# Patient Record
Sex: Female | Born: 1935 | Race: White | Hispanic: No | Marital: Single | Smoking: Never smoker
Health system: Southern US, Community
[De-identification: ages and names within clinical notes are randomized; demographics above are authoritative.]

## PROBLEM LIST (undated history)

## (undated) DIAGNOSIS — E7800 Pure hypercholesterolemia, unspecified: Secondary | ICD-10-CM

## (undated) DIAGNOSIS — I10 Essential (primary) hypertension: Secondary | ICD-10-CM

## (undated) HISTORY — PX: EYE SURGERY: SHX253

## (undated) HISTORY — PX: TONSILLECTOMY: SUR1361

## (undated) HISTORY — PX: THYROID SURGERY: SHX805

---

## 2011-03-10 DIAGNOSIS — I4510 Unspecified right bundle-branch block: Secondary | ICD-10-CM | POA: Insufficient documentation

## 2011-08-30 DIAGNOSIS — E7800 Pure hypercholesterolemia, unspecified: Secondary | ICD-10-CM | POA: Insufficient documentation

## 2011-08-30 DIAGNOSIS — E213 Hyperparathyroidism, unspecified: Secondary | ICD-10-CM | POA: Insufficient documentation

## 2011-08-30 DIAGNOSIS — I10 Essential (primary) hypertension: Secondary | ICD-10-CM | POA: Insufficient documentation

## 2012-03-19 ENCOUNTER — Encounter (HOSPITAL_COMMUNITY): Payer: Self-pay | Admitting: Emergency Medicine

## 2012-03-19 ENCOUNTER — Emergency Department (HOSPITAL_COMMUNITY)
Admission: EM | Admit: 2012-03-19 | Discharge: 2012-03-19 | Disposition: A | Discharge status: Home / Self Care | Payer: Medicare Other | Attending: Emergency Medicine | Admitting: Emergency Medicine

## 2012-03-19 ENCOUNTER — Emergency Department (HOSPITAL_COMMUNITY): Payer: Medicare Other

## 2012-03-19 DIAGNOSIS — R911 Solitary pulmonary nodule: Secondary | ICD-10-CM

## 2012-03-19 DIAGNOSIS — Z79899 Other long term (current) drug therapy: Secondary | ICD-10-CM | POA: Insufficient documentation

## 2012-03-19 DIAGNOSIS — Z7982 Long term (current) use of aspirin: Secondary | ICD-10-CM | POA: Insufficient documentation

## 2012-03-19 DIAGNOSIS — R079 Chest pain, unspecified: Secondary | ICD-10-CM | POA: Insufficient documentation

## 2012-03-19 DIAGNOSIS — I10 Essential (primary) hypertension: Secondary | ICD-10-CM | POA: Insufficient documentation

## 2012-03-19 DIAGNOSIS — R112 Nausea with vomiting, unspecified: Secondary | ICD-10-CM | POA: Insufficient documentation

## 2012-03-19 DIAGNOSIS — E860 Dehydration: Secondary | ICD-10-CM | POA: Insufficient documentation

## 2012-03-19 DIAGNOSIS — R918 Other nonspecific abnormal finding of lung field: Secondary | ICD-10-CM | POA: Insufficient documentation

## 2012-03-19 DIAGNOSIS — K219 Gastro-esophageal reflux disease without esophagitis: Secondary | ICD-10-CM | POA: Insufficient documentation

## 2012-03-19 DIAGNOSIS — E7800 Pure hypercholesterolemia, unspecified: Secondary | ICD-10-CM | POA: Insufficient documentation

## 2012-03-19 HISTORY — DX: Pure hypercholesterolemia, unspecified: E78.00

## 2012-03-19 HISTORY — DX: Essential (primary) hypertension: I10

## 2012-03-19 LAB — CBC WITH DIFFERENTIAL/PLATELET
Basophils Relative: 0 % (ref 0–1)
Eosinophils Absolute: 0 10*3/uL (ref 0.0–0.7)
Eosinophils Relative: 0 % (ref 0–5)
Hemoglobin: 14 g/dL (ref 12.0–15.0)
MCH: 29.4 pg (ref 26.0–34.0)
MCHC: 32.9 g/dL (ref 30.0–36.0)
MCV: 89.3 fL (ref 78.0–100.0)
Monocytes Absolute: 0.6 10*3/uL (ref 0.1–1.0)
Monocytes Relative: 5 % (ref 3–12)
Neutrophils Relative %: 91 % — ABNORMAL HIGH (ref 43–77)

## 2012-03-19 LAB — COMPREHENSIVE METABOLIC PANEL
Albumin: 3.8 g/dL (ref 3.5–5.2)
BUN: 20 mg/dL (ref 6–23)
Calcium: 10.8 mg/dL — ABNORMAL HIGH (ref 8.4–10.5)
Creatinine, Ser: 0.79 mg/dL (ref 0.50–1.10)
GFR calc Af Amer: >90 mL/min (ref >90)
Potassium: 4.3 mEq/L (ref 3.5–5.1)
Total Protein: 6.8 g/dL (ref 6.0–8.3)

## 2012-03-19 LAB — TROPONIN I
Troponin I: <0.3 ng/mL (ref <0.30)
Troponin I: <0.3 ng/mL (ref <0.30)

## 2012-03-19 LAB — LIPASE, BLOOD: Lipase: 29 U/L (ref 11–59)

## 2012-03-19 MED ORDER — ONDANSETRON 4 MG PO TBDP: ORAL_TABLET | ORAL | Status: DC

## 2012-03-19 MED ORDER — SODIUM CHLORIDE 0.9 % IV BOLUS (SEPSIS)
1000.0000 mL | Freq: Once | INTRAVENOUS | Status: AC
  Administered 2012-03-19: 1000 mL via INTRAVENOUS

## 2012-03-19 MED ORDER — FAMOTIDINE IN NACL 20-0.9 MG/50ML-% IV SOLN
20.0000 mg | Freq: Once | INTRAVENOUS | Status: AC
  Administered 2012-03-19: 20 mg via INTRAVENOUS
  Filled 2012-03-19: qty 50

## 2012-03-19 MED ORDER — ONDANSETRON HCL 4 MG/2ML IJ SOLN
4.0000 mg | Freq: Once | INTRAMUSCULAR | Status: AC
  Administered 2012-03-19: 4 mg via INTRAVENOUS
  Filled 2012-03-19: qty 2

## 2012-03-19 MED ORDER — GI COCKTAIL ~~LOC~~
30.0000 mL | Freq: Once | ORAL | Status: DC
  Filled 2012-03-19: qty 30

## 2012-03-19 NOTE — ED Notes (Signed)
Patient unable to tolerate eating any of her food tray. She drank her tea only.

## 2012-03-19 NOTE — ED Provider Notes (Signed)
History     CSN: [id number]  Arrival date & time 03/19/12  7829   First MD Initiated Contact with Patient 03/19/12 [phone]      Chief Complaint  Patient presents with  . Heartburn  . Nausea  . Emesis    (Consider location/radiation/quality/duration/timing/severity/associated sxs/prior treatment) The history is provided by the patient.  Susan Shaffer is a 78 y.o. female hx of HTN, HL here with nausea, vomiting, and burning chest pain. Burning sensation in her chest since yesterday. Intermittent, worse at night and with eating a lot of food. No fever or chills or cough. + vomiting last night. No abdominal pain or diarrhea. No sick contacts. No hx of CAD or stents.    Past Medical History  Diagnosis Date  . Hypertension   . High cholesterol     Past Surgical History  Procedure Date  . Eye surgery cataract  . Tonsillectomy     No family history on file.  History  Substance Use Topics  . Smoking status: Never Smoker   . Smokeless tobacco: Not on file  . Alcohol Use: 0.6 oz/week    1 Glasses of wine per week     Comment: ocassional1    OB History    Grav Para Term Preterm Abortions TAB SAB Ect Mult Living                  Review of Systems  Cardiovascular: Positive for chest pain.  Gastrointestinal: Positive for vomiting.  All other systems reviewed and are negative.    Allergies  Alavert and Doxycycline  Home Medications   Current Outpatient Rx  Name  Route  Sig  Dispense  Refill  . AMLODIPINE BESYLATE 5 MG PO TABS   Oral   Take 5 mg by mouth daily.         . ASPIRIN 81 MG PO CHEW   Oral   Chew 324 mg by mouth once.         Marland Kitchen BETAMETHASONE VALERATE 0.12 % EX FOAM   Topical   Apply 1 application topically daily as needed. Apply behind ears as needed for rash         . CALCIUM CARBONATE ANTACID 500 MG PO CHEW   Oral   Chew 2 tablets by mouth daily as needed. For heartburn         . CLOTRIMAZOLE 1 % EX CREA   Topical   Apply 1  application topically 2 (two) times daily as needed. Apply to feet as needed for itching         . PEPCID COMPLETE PO   Oral   Take 1 tablet by mouth daily as needed. For heartburn         . FLUOXETINE HCL 20 MG PO CAPS   Oral   Take 20 mg by mouth daily.         Marland Kitchen GLUCOSAMINE MSM COMPLEX PO   Oral   Take 1 tablet by mouth daily.         . ADULT MULTIVITAMIN W/MINERALS CH   Oral   Take 1 tablet by mouth daily.         Marland Kitchen PRAMOXINE HCL 1 % RE FOAM   Rectal   Place 1 application rectally daily as needed. As needed for bleeding hemorrhoids         . PRAVASTATIN SODIUM 20 MG PO TABS   Oral   Take 20 mg by mouth daily with supper.  BP 117/61  Pulse 74  Temp 98.4 F (36.9 C) (Oral)  Resp 16  SpO2 95%  Physical Exam  Nursing note and vitals reviewed. Constitutional: She is oriented to person, place, and time. She appears well-developed and well-nourished.  HENT:  Head: Normocephalic.       MM dry   Eyes: Conjunctivae normal are normal. Pupils are equal, round, and reactive to light.  Neck: Normal range of motion. Neck supple.  Cardiovascular: Normal rate, regular rhythm and normal heart sounds.   Pulmonary/Chest: Effort normal and breath sounds normal. No respiratory distress. She has no wheezes. She has no rales.  Abdominal: Soft. Bowel sounds are normal. She exhibits no distension. There is no tenderness. There is no rebound.  Musculoskeletal: Normal range of motion. She exhibits no edema and no tenderness.  Neurological: She is alert and oriented to person, place, and time.  Skin: Skin is warm and dry.  Psychiatric: She has a normal mood and affect. Her behavior is normal. Judgment and thought content normal.    ED Course  Procedures (including critical care time)  Labs Reviewed  CBC WITH DIFFERENTIAL - Abnormal; Notable for the following:    WBC 11.3 (*)     Neutrophils Relative 91 (*)     Neutro Abs 10.3 (*)     Lymphocytes Relative 4  (*)     Lymphs Abs 0.4 (*)     All other components within normal limits  COMPREHENSIVE METABOLIC PANEL - Abnormal; Notable for the following:    Glucose, Bld 121 (*)     Calcium 10.8 (*)     GFR calc non Af Amer 78 (*)     All other components within normal limits  TROPONIN I  LIPASE, BLOOD  TROPONIN I   Dg Chest 2 View  03/19/2012  *RADIOLOGY REPORT*  Clinical Data: Shortness of breath  CHEST - 2 VIEW  Comparison: None.  Findings: 9 mm nodular opacity projects over the right lower lobe, over the right posterior eighth rib.  Minimal scarring or atelectasis, left base.  Heart size is normal.  Aorta is minimally ectatic and unfolded.  Lungs are otherwise clear.  No acute osseous finding.  IMPRESSION: No acute cardiopulmonary process.  9 mm nodular opacity overlying the right lower lobe as above.  Outpatient nonemergent follow-up chest CT is recommended for further evaluation.   Original Report Authenticated By: Christiana Pellant, M.D.      No diagnosis found.   Date: 03/19/2012  Rate: 77  Rhythm: normal sinus rhythm  QRS Axis: normal  Intervals: normal  ST/T Wave abnormalities: normal  Conduction Disutrbances:right bundle branch block and left anterior fascicular block  Narrative Interpretation:   Old EKG Reviewed: none available    MDM  Susan Shaffer is a 78 y.o. female here with burning chest pain and dehydration. Likely reflux vs gastritis. Low risk for ACS, but given age, will need trop x 2. Will give GI cocktail, IVF and reassess.   12:57 PM Felt better after meds. Trop neg x 2. Labs unremarkable. CXR showed a R lobe nodule. Will d/c home with prn zofran and pepcid.        Richardean Canal, MD 03/19/12 1300

## 2012-03-19 NOTE — ED Notes (Signed)
Received pt from home with c/o heartburn feeling onset yesterday. Pt reports heartburn continued until she went to bed. Pt woke up this morning with heartburn again along with nausea and vomiting. Pt reports heartburn relieved after vomiting. Pt given 4mg  of zofran by EMS.

## 2012-03-19 NOTE — ED Notes (Signed)
Food  Tray given

## 2015-12-27 DIAGNOSIS — I071 Rheumatic tricuspid insufficiency: Secondary | ICD-10-CM | POA: Insufficient documentation

## 2017-03-05 ENCOUNTER — Encounter (HOSPITAL_COMMUNITY): Payer: Self-pay | Admitting: Psychiatry

## 2017-03-05 ENCOUNTER — Ambulatory Visit (HOSPITAL_COMMUNITY): Payer: Medicare Other | Admitting: Psychiatry

## 2017-03-05 VITALS — BP 140/84 | HR 69 | Ht 62.0 in | Wt 151.0 lb

## 2017-03-05 DIAGNOSIS — Z818 Family history of other mental and behavioral disorders: Secondary | ICD-10-CM | POA: Diagnosis not present

## 2017-03-05 DIAGNOSIS — F419 Anxiety disorder, unspecified: Secondary | ICD-10-CM

## 2017-03-05 DIAGNOSIS — F410 Panic disorder [episodic paroxysmal anxiety] without agoraphobia: Secondary | ICD-10-CM

## 2017-03-05 DIAGNOSIS — F330 Major depressive disorder, recurrent, mild: Secondary | ICD-10-CM | POA: Diagnosis not present

## 2017-03-05 MED ORDER — FLUOXETINE HCL 10 MG PO CAPS: ORAL_CAPSULE | ORAL | 1 refills | Status: DC

## 2017-03-05 NOTE — Progress Notes (Signed)
Psychiatric Initial Adult Assessment   Patient Identification: Susan Shaffer MRN:  [id number] Date of Evaluation:  03/05/2017 Referral Source: Self-referred Chief Complaint:  I lost motivation in my life.  I do not enjoy as much.  Visit Diagnosis:    ICD-10-CM   1. MDD (major depressive disorder), recurrent episode, mild (HCC) F33.0 FLUoxetine (PROZAC) 10 MG capsule    History of Present Illness: Patient is a 83 year old Caucasian, married, retired female who is self-referred for seeking help for her anxiety and depression.  Patient came with her daughter who is also a patient in this office.  Patient mentions for past 2 years she has been struggling with her depression.  She is taking care of 81 year old husband who has dementia and multiple health issues including stroke and cardiac issues.  Patient told that together they used to enjoy a lot but lately since husband has significant memory problem she feels very lonely, isolated, withdrawn, depressed and sad.  She has no motivation to do many things.  She cut down her socialization and feels guilty leaving him alone.  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  Sometimes her older daughter helps her out but patient believes she is very controlling and sometimes do not get along with her.  Patient feels sometimes irritable, frustrated, discouraged and reported decreased energy and appetite.  [place]  Together they have lot of good friends but lately she has cut down due to her husband's health.  Patient also had a fracture of the right wrist 2 weeks ago that also causes lack of socialization.  She also endorsed that she stopped going to church because her husband unable to handle the crowd.  She feels very sorry about her husband who now have difficulty even recognizing her daughter's home.  Patient admitted sometimes having racing thoughts and early awakening but denies any suicidal thoughts or homicidal thoughts.   She denies any crying spells but feels sometimes anhedonia, decreased appetite and panic attacks.  Patient denies any delusion, paranoia, aggressive behavior, poor impulse control, mania or any self abusive behavior.  She is to take Prozac prescribed by primary care physician for 18 years which was recently changed to Pristiq.  She does not believe Pristiq is working as good.  Patient denies drinking alcohol or using any illegal substances.  She denies any nightmares, flashbacks, OCD or any PTSD symptoms.  Associated Signs/Symptoms: Depression Symptoms:  depressed mood, anhedonia, fatigue, feelings of worthlessness/guilt, difficulty concentrating, hopelessness, anxiety, panic attacks, disturbed sleep, (Hypo) Manic Symptoms:  Irritable Mood, Anxiety Symptoms:  Excessive Worry, Panic Symptoms, Psychotic Symptoms:  No psychotic symptoms PTSD Symptoms: Negative  Past Psychiatric History:  Patient denies any previous history of psychiatric inpatient treatment or any suicidal intent.  She denies any history of paranoia, delusion, irritability, mania or psychosis.  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  Patient denies any side effects from Prozac.  Recently her primary care physician switched to Pristiq  Previous Psychotropic Medications: Yes   Substance Abuse History in the last 12 months:  No.  Consequences of Substance Abuse: Negative  Past Medical History:  Past Medical History:  Diagnosis Date  . High cholesterol   . Hypertension     Past Surgical History:  Procedure Laterality Date  . EYE SURGERY  cataract  . TONSILLECTOMY      Family Psychiatric History: Patient mother has depression and she took Prozac.  Family  History: No family history on file.  Social History:   Social History   Socioeconomic History  . Marital status: Single    Spouse name: Not on file  . Number of children: Not on file  . Years of education:  Not on file  . Highest education level: Not on file  Social Needs  . Financial resource strain: Not on file  . Food insecurity - worry: Not on file  . Food insecurity - inability: Not on file  . Transportation needs - medical: Not on file  . Transportation needs - non-medical: Not on file  Occupational History  . Not on file  Tobacco Use  . Smoking status: Never Smoker  Substance and Sexual Activity  . Alcohol use: Yes    Alcohol/week: 0.6 oz    Types: 1 Glasses of wine per week    Comment: ocassional1  . Drug use: No  . Sexual activity: Not on file  Other Topics Concern  . Not on file  Social History Narrative  . Not on file    Additional Social History: Patient is married for 66 years.  She has 2 daughter.  She is retired.  She born and raised in [place].  Allergies:   Allergies  Allergen Reactions  . Alavert [Loratadine] Itching and Swelling  . Doxycycline Itching and Swelling    Metabolic Disorder Labs: No results found for: HGBA1C, MPG No results found for: PROLACTIN No results found for: CHOL, TRIG, HDL, CHOLHDL, VLDL, LDLCALC   Current Medications: Current Outpatient Medications  Medication Sig Dispense Refill  . amLODipine (NORVASC) 5 MG tablet Take 5 mg by mouth daily.    Marland Kitchen aspirin 81 MG chewable tablet Chew 324 mg by mouth once.    . Betamethasone Valerate 0.12 % foam Apply 1 application topically daily as needed. Apply behind ears as needed for rash    . calcium carbonate (TUMS - DOSED IN MG ELEMENTAL CALCIUM) 500 MG chewable tablet Chew 2 tablets by mouth daily as needed. For heartburn    . clotrimazole (CLOTRIMAZOLE AF) 1 % cream Apply 1 application topically 2 (two) times daily as needed. Apply to feet as needed for itching    . Famotidine-Ca Carb-Mag Hydrox (PEPCID COMPLETE PO) Take 1 tablet by mouth daily as needed. For heartburn    . FLUoxetine (PROZAC) 20 MG capsule Take 20 mg by mouth daily.    Stephanie Acre (GLUCOSAMINE  MSM COMPLEX PO) Take 1 tablet by mouth daily.    . Multiple Vitamin (MULTIVITAMIN WITH MINERALS) TABS Take 1 tablet by mouth daily.    . ondansetron (ZOFRAN ODT) 4 MG disintegrating tablet 4mg  ODT q4 hours prn nausea/vomit 6 tablet 0  . pramoxine (PROCTOFOAM) 1 % foam Place 1 application rectally daily as needed. As needed for bleeding hemorrhoids    . pravastatin (PRAVACHOL) 20 MG tablet Take 20 mg by mouth daily with supper.     No current facility-administered medications for this visit.     Neurologic: Headache: No Seizure: No Paresthesias:No  Musculoskeletal: Strength & Muscle Tone: within normal limits Gait & Station: normal Patient leans: N/A  Psychiatric Specialty Exam: ROS  Blood pressure 140/84, pulse 69, height 5\' 2"  (1.575 m), weight 151 lb (68.5 kg).There is no height or weight on file to calculate BMI.  General Appearance: Well Groomed  Eye Contact:  Good  Speech:  Clear and Coherent  Volume:  Normal  Mood:  Anxious and Depressed  Affect:  Congruent  Thought Process:  Goal  Directed  Orientation:  Full (Time, Place, and Person)  Thought Content:  Logical and Rumination  Suicidal Thoughts:  No  Homicidal Thoughts:  No  Memory:  Immediate;   Good Recent;   Good Remote;   Good  Judgement:  Good  Insight:  Good  Psychomotor Activity:  Normal  Concentration:  Concentration: Good and Attention Span: Good  Recall:  Good  Fund of Knowledge:Good  Language: Good  Akathisia:  No  Handed:  Right  AIMS (if indicated):  0  Assets:  Communication Skills Desire for Improvement Housing Resilience Social Support  ADL's:  Intact  Cognition: WNL  Sleep:  ok   Assessment: Major depressive disorder, recurrent mild.  Anxiety disorder NOS.  Plan: I reviewed her psychosocial stressors, current medication, history.  Patient had a good response with Prozac.  Recommended to restart Prozac which helped her in the past.  She will start Prozac 10 mg daily for 1 week and then  20 mg daily.  I also recommended to cut down the Pristiq from 50 mg to 25 mg for 1 week and then stop.  I do believe patient should see a therapist for coping and social skills.  Patient agreed with the plan.  She will find a therapist in [place] since it is close to her living area.  Recommended to call us back if he has any question, concern or if she feels worsening of the symptoms.  We will consider optimizing the dose of Prozac if needed on her next appointment.  Follow-up in 4-6 weeks.  Cleotis NipperSyed T Kalid Ghan, MD 12/11/20181:21 PM

## 2017-03-27 ENCOUNTER — Encounter (HOSPITAL_COMMUNITY): Payer: Self-pay | Admitting: Family Medicine

## 2017-03-27 ENCOUNTER — Emergency Department (HOSPITAL_COMMUNITY)
Admission: EM | Admit: 2017-03-27 | Discharge: 2017-03-28 | Disposition: A | Discharge status: Home / Self Care | Payer: Medicare Other | Attending: Emergency Medicine | Admitting: Emergency Medicine

## 2017-03-27 ENCOUNTER — Emergency Department (HOSPITAL_COMMUNITY): Payer: Medicare Other

## 2017-03-27 DIAGNOSIS — Z79899 Other long term (current) drug therapy: Secondary | ICD-10-CM | POA: Diagnosis not present

## 2017-03-27 DIAGNOSIS — R55 Syncope and collapse: Secondary | ICD-10-CM | POA: Diagnosis present

## 2017-03-27 DIAGNOSIS — N390 Urinary tract infection, site not specified: Secondary | ICD-10-CM

## 2017-03-27 DIAGNOSIS — Z7982 Long term (current) use of aspirin: Secondary | ICD-10-CM | POA: Insufficient documentation

## 2017-03-27 DIAGNOSIS — I10 Essential (primary) hypertension: Secondary | ICD-10-CM | POA: Insufficient documentation

## 2017-03-27 LAB — BASIC METABOLIC PANEL
Anion gap: 8 (ref 5–15)
BUN: 24 mg/dL — ABNORMAL HIGH (ref 6–20)
CO2: 26 mmol/L (ref 22–32)
Calcium: 8.7 mg/dL — ABNORMAL LOW (ref 8.9–10.3)
Chloride: 106 mmol/L (ref 101–111)
Creatinine, Ser: 0.91 mg/dL (ref 0.44–1.00)
GFR calc non Af Amer: 57 mL/min — ABNORMAL LOW (ref >60)
Glucose, Bld: 113 mg/dL — ABNORMAL HIGH (ref 65–99)
POTASSIUM: 3.8 mmol/L (ref 3.5–5.1)
SODIUM: 140 mmol/L (ref 135–145)

## 2017-03-27 LAB — URINALYSIS, ROUTINE W REFLEX MICROSCOPIC
BACTERIA UA: NONE SEEN
Bilirubin Urine: NEGATIVE
GLUCOSE, UA: NEGATIVE mg/dL
KETONES UR: NEGATIVE mg/dL
Nitrite: NEGATIVE
PROTEIN: NEGATIVE mg/dL
Specific Gravity, Urine: 1.005 (ref 1.005–1.030)
pH: 7 (ref 5.0–8.0)

## 2017-03-27 LAB — CBC
HEMATOCRIT: 43.9 % (ref 36.0–46.0)
HEMOGLOBIN: 14.4 g/dL (ref 12.0–15.0)
MCH: 29.6 pg (ref 26.0–34.0)
MCHC: 32.8 g/dL (ref 30.0–36.0)
MCV: 90.3 fL (ref 78.0–100.0)
Platelets: 235 10*3/uL (ref 150–400)
RBC: 4.86 MIL/uL (ref 3.87–5.11)
RDW: 13.3 % (ref 11.5–15.5)
WBC: 15.7 10*3/uL — AB (ref 4.0–10.5)

## 2017-03-27 LAB — CBG MONITORING, ED: Glucose-Capillary: 115 mg/dL — ABNORMAL HIGH (ref 65–99)

## 2017-03-27 LAB — I-STAT TROPONIN, ED
TROPONIN I, POC: 0 ng/mL (ref 0.00–0.08)
Troponin i, poc: 0 ng/mL (ref 0.00–0.08)

## 2017-03-27 MED ORDER — SODIUM CHLORIDE 0.9 % IV BOLUS (SEPSIS)
500.0000 mL | Freq: Once | INTRAVENOUS | Status: AC
  Administered 2017-03-27: 500 mL via INTRAVENOUS

## 2017-03-27 MED ORDER — CEPHALEXIN 500 MG PO CAPS: 500.0000 mg | ORAL_CAPSULE | Freq: Two times a day (BID) | ORAL | 0 refills | Status: AC

## 2017-03-27 MED ORDER — CEFTRIAXONE SODIUM 1 G IJ SOLR
1.0000 g | Freq: Once | INTRAMUSCULAR | Status: AC
  Administered 2017-03-27: 1 g via INTRAVENOUS
  Filled 2017-03-27: qty 10

## 2017-03-27 MED ORDER — ONDANSETRON HCL 4 MG/2ML IJ SOLN
4.0000 mg | Freq: Once | INTRAMUSCULAR | Status: AC
  Administered 2017-03-27: 4 mg via INTRAVENOUS
  Filled 2017-03-27: qty 2

## 2017-03-27 NOTE — ED Notes (Signed)
Bed: WA09 Expected date:  Expected time:  Means of arrival:  Comments: Triage 2 

## 2017-03-27 NOTE — ED Provider Notes (Signed)
[facility]-EMERGENCY DEPT Provider Note   CSN: [id number] Arrival date & time: 03/27/17  1603     History   Chief Complaint Chief Complaint  Patient presents with  . Loss of Consciousness    HPI Susan Shaffer is a 84 y.o. female.  Patient is a 84 year old female with a history of hypertension, hyperlipidemia and depression who presents after syncopal event.  Her family states that she has been very stressed and anxious over taking care of her husband who is been ill and demented.  They recently brought her home for about a week or so to recuperate while the patient's husband is been taking care of in a hospice facility.  However she has not really been getting better as far as she has not been gaining her strength back like they thought she would.  She has been very fatigued and wants to sleep all the time.  Today she took a shower which she has not felt like doing in a couple days.  She was getting out of the shower, she felt very tired and laid back on the bed and was found by her granddaughter unarousable.  She had some fluid pooling in her mouth.  She was like this for about 3 minutes before they can wake her up.  There is no visible seizure activity.  She is able to move all extremities symmetrically without focal deficits.  She denies any palpitations.  No chest pain.  She had no symptoms preceding the event other than feeling hot and tired.  She has not changed any recent medicines other than switching from Pristiq to Prozac.  She denies any recent nausea vomiting diarrhea.  No recent fevers.  She has a little bit of chest congestion but otherwise no cold symptoms.  No urinary symptoms.  She has not been eating well but did eat a [employer] sandwich at lunchtime today.      Past Medical History:  Diagnosis Date  . High cholesterol   . Hypertension     Patient Active Problem List   Diagnosis Date Noted  . Tricuspid valve regurgitation 12/27/2015  .  Essential hypertension 08/30/2011  . Hypercholesterolemia 08/30/2011  . Hyperparathyroidism (HCC) 08/30/2011  . Right bundle branch block 03/10/2011    Past Surgical History:  Procedure Laterality Date  . EYE SURGERY  cataract  . THYROID SURGERY     Parathyroid   . TONSILLECTOMY      OB History    No data available       Home Medications    Prior to Admission medications   Medication Sig Start Date End Date Taking? Authorizing Provider  amLODipine (NORVASC) 5 MG tablet Take 5 mg by mouth daily.   Yes [provider]  Calcium Carb-Cholecalciferol (CALCIUM-VITAMIN D) 500-200 MG-UNIT tablet Take 2 tablets by mouth daily.   Yes [provider]  Dietary Management Product (VASCULERA) TABS Take 630 mg by mouth daily.   Yes [provider]  FLUoxetine (PROZAC) 10 MG capsule Take 1 capsule daily for 1 week and than 2 capsule daily Patient taking differently: Take 20 mg by mouth daily.  03/05/17  Yes Arfeen, Phillips Grout, MD  Glucosamine-Chondroitin (COSAMIN DS PO) Take 1 tablet by mouth daily.    Yes [provider]  LORazepam (ATIVAN) 0.5 MG tablet Take 0.5 mg by mouth every 8 (eight) hours as needed for anxiety.    Yes [provider]  pravastatin (PRAVACHOL) 20 MG tablet Take 20 mg by mouth  daily with supper.   Yes [provider]  ranitidine (ZANTAC) 150 MG tablet Take 150 mg by mouth 2 (two) times daily as needed for heartburn.   Yes [provider]  simethicone (MYLICON) 80 MG chewable tablet Chew 80 mg by mouth every 6 (six) hours as needed for flatulence.   Yes [provider]  aspirin 81 MG chewable tablet Chew 324 mg by mouth once.    [provider]  Betamethasone Valerate 0.12 % foam Apply 1 application topically daily as needed. Apply behind ears as needed for rash    [provider]  calcium carbonate (TUMS - DOSED IN MG ELEMENTAL CALCIUM) 500 MG chewable tablet Chew 2 tablets by mouth daily  as needed. For heartburn    [provider]  cetirizine (ZYRTEC) 10 MG chewable tablet Chew 10 mg by mouth daily.    [provider]  clotrimazole (CLOTRIMAZOLE AF) 1 % cream Apply 1 application topically 2 (two) times daily as needed. Apply to feet as needed for itching    [provider]  Famotidine-Ca Carb-Mag Hydrox (PEPCID COMPLETE PO) Take 1 tablet by mouth daily as needed. For heartburn    [provider]  FLUoxetine (PROZAC) 20 MG tablet Take 20 mg by mouth daily.    [provider]  Glucos-MSM-C-Mn-Ginger-Willow (GLUCOSAMINE MSM COMPLEX PO) Take 1 tablet by mouth daily.    [provider]  Multiple Vitamin (MULTIVITAMIN WITH MINERALS) TABS Take 1 tablet by mouth daily.    [provider]  pramoxine (PROCTOFOAM) 1 % foam Place 1 application rectally daily as needed. As needed for bleeding hemorrhoids    [provider]  triamcinolone cream (KENALOG) 0.1 % Apply 1 application topically daily as needed.    [provider]    Family History History reviewed. No pertinent family history.  Social History Social History   Tobacco Use  . Smoking status: Never Smoker  . Smokeless tobacco: Never Used  Substance Use Topics  . Alcohol use: No    Alcohol/week: 0.6 oz    Types: 1 Glasses of wine per week    Frequency: Never  . Drug use: No     Allergies   Lactose intolerance (gi); Alavert [loratadine]; and Doxycycline   Review of Systems Review of Systems  Constitutional: Positive for appetite change and fatigue. Negative for chills, diaphoresis and fever.  HENT: Positive for congestion. Negative for rhinorrhea and sneezing.   Eyes: Negative.   Respiratory: Positive for cough. Negative for chest tightness and shortness of breath.   Cardiovascular: Negative for chest pain and leg swelling.  Gastrointestinal: Negative for abdominal pain, blood in stool, diarrhea, nausea and vomiting.  Genitourinary:  Negative for difficulty urinating, flank pain, frequency and hematuria.  Musculoskeletal: Negative for arthralgias and back pain.  Skin: Negative for rash.  Neurological: Negative for dizziness, speech difficulty, weakness, numbness and headaches.     Physical Exam Updated Vital Signs BP 120/75   Pulse 83   Resp 10   Ht 5\' 2"  (1.575 m)   Wt 67.1 kg (148 lb)   SpO2 97%   BMI 27.07 kg/m   Physical Exam  Constitutional: She is oriented to person, place, and time. She appears well-developed and well-nourished.  HENT:  Head: Normocephalic and atraumatic.  Slightly dry mucous membranes  Eyes: Pupils are equal, round, and reactive to light.  Neck: Normal range of motion. Neck supple.  Cardiovascular: Normal rate, regular rhythm and normal heart sounds.  Pulmonary/Chest: Effort normal  and breath sounds normal. No respiratory distress. She has no wheezes. She has no rales. She exhibits no tenderness.  Abdominal: Soft. Bowel sounds are normal. There is no tenderness. There is no rebound and no guarding.  Musculoskeletal: Normal range of motion. She exhibits no edema.  Lymphadenopathy:    She has no cervical adenopathy.  Neurological: She is alert and oriented to person, place, and time.  Motor 5 out of 5 all extremities, sensation grossly intact light touch all extremities, no facial drooping or speech deficits.  Skin: Skin is warm and dry. No rash noted.  Psychiatric: She has a normal mood and affect.     ED Treatments / Results  Labs (all labs ordered are listed, but only abnormal results are displayed) Labs Reviewed  BASIC METABOLIC PANEL - Abnormal; Notable for the following components:      Result Value   Glucose, Bld 113 (*)    BUN 24 (*)    Calcium 8.7 (*)    GFR calc non Af Amer 57 (*)    All other components within normal limits  CBC - Abnormal; Notable for the following components:   WBC 15.7 (*)    All other components within normal limits  URINALYSIS, ROUTINE W  REFLEX MICROSCOPIC - Abnormal; Notable for the following components:   Color, Urine STRAW (*)    Hgb urine dipstick SMALL (*)    Leukocytes, UA MODERATE (*)    Squamous Epithelial / LPF 0-5 (*)    All other components within normal limits  CBG MONITORING, ED - Abnormal; Notable for the following components:   Glucose-Capillary 115 (*)    All other components within normal limits  URINE CULTURE  I-STAT TROPONIN, ED  I-STAT TROPONIN, ED    EKG  EKG Interpretation  Date/Time:  Wednesday March 27 2017 22:11:59 EST Ventricular Rate:  78 PR Interval:    QRS Duration: 145 QT Interval:  434 QTC Calculation: 495 R Axis:   -78 Text Interpretation:  Sinus rhythm Atrial premature complex RBBB and LAFB similar to EKG from same day Confirmed by Rolan Bucco [phone]) on 03/27/2017 11:22:53 PM       Radiology Dg Chest 2 View  Result Date: 03/27/2017 CLINICAL DATA:  Cough for 2 days. Syncopal episode. History of hypertension. Nonsmoker. EXAM: CHEST  2 VIEW COMPARISON:  03/19/2012 FINDINGS: Normal heart size and pulmonary vascularity. No airspace disease or consolidation in the lungs. No blunting of costophrenic angles. No pneumothorax. Nodule in the right mid lung is unchanged since previous study suggesting benign etiology. Mild calcification of the aorta. Old left rib fractures. IMPRESSION: No active cardiopulmonary disease. Electronically Signed   By: Burman Nieves M.D.   On: 03/27/2017 18:09    Procedures Procedures (including critical care time)  Medications Ordered in ED Medications  sodium chloride 0.9 % bolus 500 mL (0 mLs Intravenous Stopped 03/27/17 1830)  cefTRIAXone (ROCEPHIN) 1 g in dextrose 5 % 50 mL IVPB (0 g Intravenous Stopped 03/27/17 2051)  sodium chloride 0.9 % bolus 500 mL (0 mLs Intravenous Stopped 03/27/17 2222)  ondansetron (ZOFRAN) injection 4 mg (4 mg Intravenous Given 03/27/17 2222)     Initial Impression / Assessment and Plan / ED Course  I have reviewed the  triage vital signs and the nursing notes.  Pertinent labs & imaging results that were available during my care of the patient were reviewed by me and considered in my medical decision making (see chart for details).     Patient is  a 84 year old female who presents after a syncopal episode.  She had no seizure activity.  She had sensation of getting hot prior to the episode but no palpitations chest pain or shortness of breath.  She has a sinus rhythm with T wave inversion but no other cardiac symptoms.  She has had 2- troponins.  She does have evidence of urinary tract infection and states that she has passed out before with a urinary tract infection once in the past.  She is also passed out once in the past with dehydration.  She has dry mucous membranes and was given IV fluids in the ED as well as Rocephin.  She is feeling much better.  She has ambulated without dizziness or other symptoms.  She is tolerating oral fluids.  She initially had some nausea but was given a dose of Zofran and now is feeling much better.  I gave her the option of staying overnight for observation versus outpatient treatment.  At this point she feels much better and wants to try outpatient treatment.  I feel that this is appropriate.  Her urine was sent for culture.  She was discharged home in good condition.  She was started on Keflex.  She was given strict return precautions.  She was advised to follow-up with her PCP who is in [place].  She is going back to [facility] in the next day or 2.  Final Clinical Impressions(s) / ED Diagnoses   Final diagnoses:  Syncope and collapse  Lower urinary tract infectious disease    ED Discharge Orders    None       Rolan Bucco, MD 03/27/17 2336

## 2017-03-27 NOTE — ED Notes (Signed)
Patient ambulated in hallway and to the bathroom without difficulty. Stated she felt a little light headed after returning but resolved. Vitals were stable. Patient reports she feels fine at this time. Denies any dizziness or pain.

## 2017-03-27 NOTE — ED Triage Notes (Signed)
Patient is from home and had a witnessed 3 minute syncopal episode by patients grand daughter. When EMS arrived, patient was pale but alert with orientation x 3. EMS reports no change in orthostatics. ZOFRAN 4mg  given IV with EMS.

## 2017-03-27 NOTE — ED Notes (Signed)
Patient transported to X-ray 

## 2017-03-29 LAB — URINE CULTURE

## 2017-04-16 ENCOUNTER — Ambulatory Visit (HOSPITAL_COMMUNITY): Payer: Medicare Other | Admitting: Psychiatry

## 2017-04-16 ENCOUNTER — Encounter (HOSPITAL_COMMUNITY): Payer: Self-pay | Admitting: Psychiatry

## 2017-04-16 VITALS — BP 130/78 | HR 67 | Ht 62.0 in | Wt 149.0 lb

## 2017-04-16 DIAGNOSIS — F419 Anxiety disorder, unspecified: Secondary | ICD-10-CM | POA: Diagnosis not present

## 2017-04-16 DIAGNOSIS — F330 Major depressive disorder, recurrent, mild: Secondary | ICD-10-CM | POA: Diagnosis not present

## 2017-04-16 DIAGNOSIS — Z79899 Other long term (current) drug therapy: Secondary | ICD-10-CM | POA: Diagnosis not present

## 2017-04-16 DIAGNOSIS — Z6379 Other stressful life events affecting family and household: Secondary | ICD-10-CM | POA: Diagnosis not present

## 2017-04-16 MED ORDER — LORAZEPAM 0.5 MG PO TABS: 0.5000 mg | ORAL_TABLET | Freq: Every day | ORAL | 0 refills | Status: AC | PRN

## 2017-04-16 MED ORDER — BUPROPION HCL ER (XL) 150 MG PO TB24
150.0000 mg | ORAL_TABLET | Freq: Every day | ORAL | 0 refills | Status: AC
Start: 2017-04-16 — End: ?

## 2017-04-16 NOTE — Progress Notes (Signed)
BH MD/PA/NP OP Progress Note  04/16/2017 1:17 PM Susan Shaffer  MRN:  [id number]  Chief Complaint: I do not see any improvement with the Prozac.  I still have no motivation and I feel tired all the time.  HPI: Susan Shaffer came for her follow-up appointment with her daughter.  They brought 3 page detailed information of recent events that happen with the patient.  Patient was seen first time 4 weeks ago in the office.  At that time she was taking Pristiq and did not see any improvement.  She remember had a good response with the Prozac in the past and we switched from Pristiq to Prozac.  However despite taking 20 mg Prozac patient do not see any improvement.  She remained very withdrawn, tired, anxious, fatigue with lack of motivation to do things.  2 weeks ago she was seen in the emergency room due to unconsciousness.  Apparently patient was dehydrated and found to have UTI.  She was given IV fluids and antibiotic.  She is doing much better.  Patient admitted family issues.  Her husband who had moderate dementia is now under hospice care and going to living memory care unit starting next week.  Patient is relief about that but she continues to have other family issues.  Her other daughter took accidental overdose and patient granddaughter lost her job and after fighting with her mother stayed with the patient for few days.  Patient feels very overwhelmed with these family events.  Patient is trying to see a therapist for counseling.  However she wants to get her medication straight before she start therapy.  She denies any crying spells or any suicidal thoughts but admitted nervousness, anxiety, fatigue and at times hopelessness.  Patient has taken a few times Ativan which helps the anxiety but also makes her tired.  She takes melatonin 5 mg at bedtime.  Patient denies any hallucination, paranoia, suicidal thoughts.  Patient's daughter who accompanied today is concerned as starting next week patient will be by  herself once patient has been moved to memory care unit.   Visit Diagnosis:    ICD-10-CM   1. MDD (major depressive disorder), recurrent episode, mild (HCC) F33.0 LORazepam (ATIVAN) 0.5 MG tablet    buPROPion (WELLBUTRIN XL) 150 MG 24 hr tablet    Past Psychiatric History: Reviewed. Patient denies any previous history of psychiatric inpatient treatment or any suicidal intent.  She denies any history of paranoia, delusion, irritability, mania or psychosis.  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  Patient denies any side effects from Prozac.  Recently her primary care physician switched to Pristiq  Past Medical History:  Past Medical History:  Diagnosis Date  . High cholesterol   . Hypertension     Past Surgical History:  Procedure Laterality Date  . EYE SURGERY  cataract  . THYROID SURGERY     Parathyroid   . TONSILLECTOMY      Family Psychiatric History: Reviewed.  Family History: No family history on file.  Social History:  Social History   Socioeconomic History  . Marital status: Single    Spouse name: None  . Number of children: None  . Years of education: None  . Highest education level: None  Social Needs  . Financial resource strain: None  . Food insecurity - worry: None  . Food insecurity - inability: None  . Transportation needs - medical: None  . Transportation needs - non-medical: None  Occupational History  . None  Tobacco Use  . Smoking status: Never Smoker  . Smokeless tobacco: Never Used  Substance and Sexual Activity  . Alcohol use: No    Alcohol/week: 0.6 oz    Types: 1 Glasses of wine per week    Frequency: Never    Comment: Occasional glass of wine  . Drug use: No  . Sexual activity: Not Currently  Other Topics Concern  . None  Social History Narrative  . None    Allergies:  Allergies  Allergen Reactions  . Lactose Intolerance (Gi)   . Alavert [Loratadine] Itching and Swelling  .  Doxycycline Itching and Swelling    Metabolic Disorder Labs: Recent Results (from the past 2160 hour(s))  Basic metabolic panel     Status: Abnormal   Collection Time: 03/27/17  5:19 PM  Result Value Ref Range   Sodium 140 135 - 145 mmol/L   Potassium 3.8 3.5 - 5.1 mmol/L   Chloride 106 101 - 111 mmol/L   CO2 26 22 - 32 mmol/L   Glucose, Bld 113 (H) 65 - 99 mg/dL   BUN 24 (H) 6 - 20 mg/dL   Creatinine, Ser 0.91 0.44 - 1.00 mg/dL   Calcium 8.7 (L) 8.9 - 10.3 mg/dL   GFR calc non Af Amer 57 (L) >60 mL/min   GFR calc Af Amer >60 >60 mL/min    Comment: (NOTE) The eGFR has been calculated using the CKD EPI equation. This calculation has not been validated in all clinical situations. eGFR's persistently <60 mL/min signify possible Chronic Kidney Disease.    Anion gap 8 5 - 15  CBC     Status: Abnormal   Collection Time: 03/27/17  5:19 PM  Result Value Ref Range   WBC 15.7 (H) 4.0 - 10.5 K/uL   RBC 4.86 3.87 - 5.11 MIL/uL   Hemoglobin 14.4 12.0 - 15.0 g/dL   HCT 43.9 36.0 - 46.0 %   MCV 90.3 78.0 - 100.0 fL   MCH 29.6 26.0 - 34.0 pg   MCHC 32.8 30.0 - 36.0 g/dL   RDW 13.3 11.5 - 15.5 %   Platelets 235 150 - 400 K/uL  CBG monitoring, ED     Status: Abnormal   Collection Time: 03/27/17  5:28 PM  Result Value Ref Range   Glucose-Capillary 115 (H) 65 - 99 mg/dL  I-stat troponin, ED     Status: None   Collection Time: 03/27/17  5:39 PM  Result Value Ref Range   Troponin i, poc 0.00 0.00 - 0.08 ng/mL   Comment 3            Comment: Due to the release kinetics of cTnI, a negative result within the first hours of the onset of symptoms does not rule out myocardial infarction with certainty. If myocardial infarction is still suspected, repeat the test at appropriate intervals.   Urinalysis, Routine w reflex microscopic     Status: Abnormal   Collection Time: 03/27/17  6:12 PM  Result Value Ref Range   Color, Urine STRAW (A) YELLOW   APPearance CLEAR CLEAR   Specific Gravity,  Urine 1.005 1.005 - 1.030   pH 7.0 5.0 - 8.0   Glucose, UA NEGATIVE NEGATIVE mg/dL   Hgb urine dipstick SMALL (A) NEGATIVE   Bilirubin Urine NEGATIVE NEGATIVE   Ketones, ur NEGATIVE NEGATIVE mg/dL   Protein, ur NEGATIVE NEGATIVE mg/dL   Nitrite NEGATIVE NEGATIVE   Leukocytes, UA MODERATE (A) NEGATIVE   RBC /  HPF 0-5 0 - 5 RBC/hpf   WBC, UA 6-30 0 - 5 WBC/hpf   Bacteria, UA NONE SEEN NONE SEEN   Squamous Epithelial / LPF 0-5 (A) NONE SEEN  Urine culture     Status: Abnormal   Collection Time: 03/27/17  6:12 PM  Result Value Ref Range   Specimen Description URINE, CLEAN CATCH    Special Requests NONE    Culture MULTIPLE SPECIES PRESENT, SUGGEST RECOLLECTION (A)    Report Status 03/29/2017 FINAL   I-stat troponin, ED     Status: None   Collection Time: 03/27/17 10:41 PM  Result Value Ref Range   Troponin i, poc 0.00 0.00 - 0.08 ng/mL   Comment 3            Comment: Due to the release kinetics of cTnI, a negative result within the first hours of the onset of symptoms does not rule out myocardial infarction with certainty. If myocardial infarction is still suspected, repeat the test at appropriate intervals.    No results found for: HGBA1C, MPG No results found for: PROLACTIN No results found for: CHOL, TRIG, HDL, CHOLHDL, VLDL, LDLCALC No results found for: TSH  Therapeutic Level Labs: No results found for: LITHIUM No results found for: VALPROATE No components found for:  CBMZ  Current Medications: Current Outpatient Medications  Medication Sig Dispense Refill  . amLODipine (NORVASC) 5 MG tablet Take 5 mg by mouth daily.    . Betamethasone Valerate 0.12 % foam Apply 1 application topically daily as needed. Apply behind ears as needed for rash    . Calcium Carb-Cholecalciferol (CALCIUM-VITAMIN D) 500-200 MG-UNIT tablet Take 2 tablets by mouth daily.    . calcium carbonate (TUMS - DOSED IN MG ELEMENTAL CALCIUM) 500 MG chewable tablet Chew 2 tablets by mouth daily as needed.  For heartburn    . clotrimazole (CLOTRIMAZOLE AF) 1 % cream Apply 1 application topically 2 (two) times daily as needed. Apply to feet as needed for itching    . Dietary Management Product (VASCULERA) TABS Take 630 mg by mouth daily.    . Famotidine-Ca Carb-Mag Hydrox (PEPCID COMPLETE PO) Take 1 tablet by mouth daily as needed. For heartburn    . FLUoxetine (PROZAC) 20 MG tablet Take 20 mg by mouth daily.    Donnie Aho (GLUCOSAMINE MSM COMPLEX PO) Take 1 tablet by mouth daily.    . Glucosamine-Chondroitin (COSAMIN DS PO) Take 1 tablet by mouth daily.     . Melatonin 5 MG TABS Take by mouth at bedtime.    . pravastatin (PRAVACHOL) 20 MG tablet Take 20 mg by mouth daily with supper.    . ranitidine (ZANTAC) 150 MG tablet Take 150 mg by mouth 2 (two) times daily as needed for heartburn.    . simethicone (MYLICON) 80 MG chewable tablet Chew 80 mg by mouth every 6 (six) hours as needed for flatulence.    . triamcinolone cream (KENALOG) 0.1 % Apply 1 application topically daily as needed.    Marland Kitchen aspirin 81 MG chewable tablet Chew 324 mg by mouth once.    . cephALEXin (KEFLEX) 500 MG capsule Take 1 capsule (500 mg total) by mouth 2 (two) times daily. (Patient not taking: Reported on 04/16/2017) 14 capsule 0  . cetirizine (ZYRTEC) 10 MG chewable tablet Chew 10 mg by mouth daily.    Marland Kitchen FLUoxetine (PROZAC) 10 MG capsule Take 1 capsule daily for 1 week and than 2 capsule daily (Patient taking differently: Take 20 mg by mouth  daily. ) 60 capsule 1  . LORazepam (ATIVAN) 0.5 MG tablet Take 0.5 mg by mouth every 8 (eight) hours as needed for anxiety.     . Multiple Vitamin (MULTIVITAMIN WITH MINERALS) TABS Take 1 tablet by mouth daily.    . pramoxine (PROCTOFOAM) 1 % foam Place 1 application rectally daily as needed. As needed for bleeding hemorrhoids     No current facility-administered medications for this visit.      Musculoskeletal: Strength & Muscle Tone: within normal limits Gait &  Station: normal Patient leans: N/A  Psychiatric Specialty Exam: Review of Systems  Constitutional: Positive for malaise/fatigue.  HENT: Negative.   Respiratory: Negative.   Skin: Negative.   Neurological: Negative.   Psychiatric/Behavioral: Positive for depression. The patient is nervous/anxious.     Blood pressure 130/78, pulse 67, height _0  (1.575 m), weight 149 lb (67.6 kg), SpO2 96 %.Body mass index is 27.25 kg/m.  General Appearance: Well Groomed  Eye Contact:  Good  Speech:  Clear and Coherent  Volume:  Normal  Mood:  Anxious  Affect:  Appropriate  Thought Process:  Goal Directed  Orientation:  Full (Time, Place, and Person)  Thought Content: Logical and Rumination   Suicidal Thoughts:  No  Homicidal Thoughts:  No  Memory:  Immediate;   Good Recent;   Good Remote;   Good  Judgement:  Good  Insight:  Good  Psychomotor Activity:  Normal  Concentration:  Concentration: Fair and Attention Span: Fair  Recall:  Good  Fund of Knowledge: Good  Language: Good  Akathisia:  No  Handed:  Right  AIMS (if indicated): not done  Assets:  Communication Skills Desire for Improvement Housing Resilience Social Support  ADL's:  Intact  Cognition: WNL  Sleep:  Fair   Screenings:   Assessment and Plan: Major depressive disorder, recurrent mild.  Anxiety disorder NOS.  I reviewed collateral information from recent visit to the emergency room and her blood work.  Patient do not see any improvement with the Prozac.  I also reviewed her detailed events which patient brought in today.  I strongly encouraged that she should see a counselor for CBT.  I would discontinue Pristiq.  We will try Wellbutrin XL 150 mg daily to help her anxiety and depressive symptoms.  I explained medication side effects in detail.  Continue lorazepam 0.5 mg for severe anxiety and panic attack.  I would also forward my notes to her primary care physician Dr. Eulas Post.  Follow-up in 4 weeks.   Kathlee Nations,  MD 04/16/2017, 1:17 PM

## 2017-04-29 ENCOUNTER — Telehealth (HOSPITAL_COMMUNITY): Payer: Self-pay | Admitting: Psychiatry

## 2017-04-29 NOTE — Telephone Encounter (Signed)
04/29/2017 - Daughter called to state her mom has brain tumor and need to stop treatment for now, as symptoms are caused from tumor. Will restart treatment as needed. Zenaida DeedJeannie Symonds is daughter.

## 2017-05-02 ENCOUNTER — Other Ambulatory Visit (HOSPITAL_COMMUNITY): Payer: Self-pay | Admitting: Psychiatry

## 2017-05-02 DIAGNOSIS — F330 Major depressive disorder, recurrent, mild: Secondary | ICD-10-CM

## 2017-05-13 ENCOUNTER — Other Ambulatory Visit (HOSPITAL_COMMUNITY): Payer: Self-pay | Admitting: Psychiatry

## 2017-05-13 DIAGNOSIS — F330 Major depressive disorder, recurrent, mild: Secondary | ICD-10-CM

## 2017-05-14 ENCOUNTER — Ambulatory Visit (HOSPITAL_COMMUNITY): Payer: Self-pay | Admitting: Psychiatry

## 2018-09-06 IMAGING — CR DG CHEST 2V
2 series · 2 of 2 positions shown · non-contrast
Comparison: 03/19/2012

CLINICAL DATA: Cough for 2 days. Syncopal episode. History of
hypertension. Nonsmoker.

EXAM:
CHEST  2 VIEW

[w chest lat]
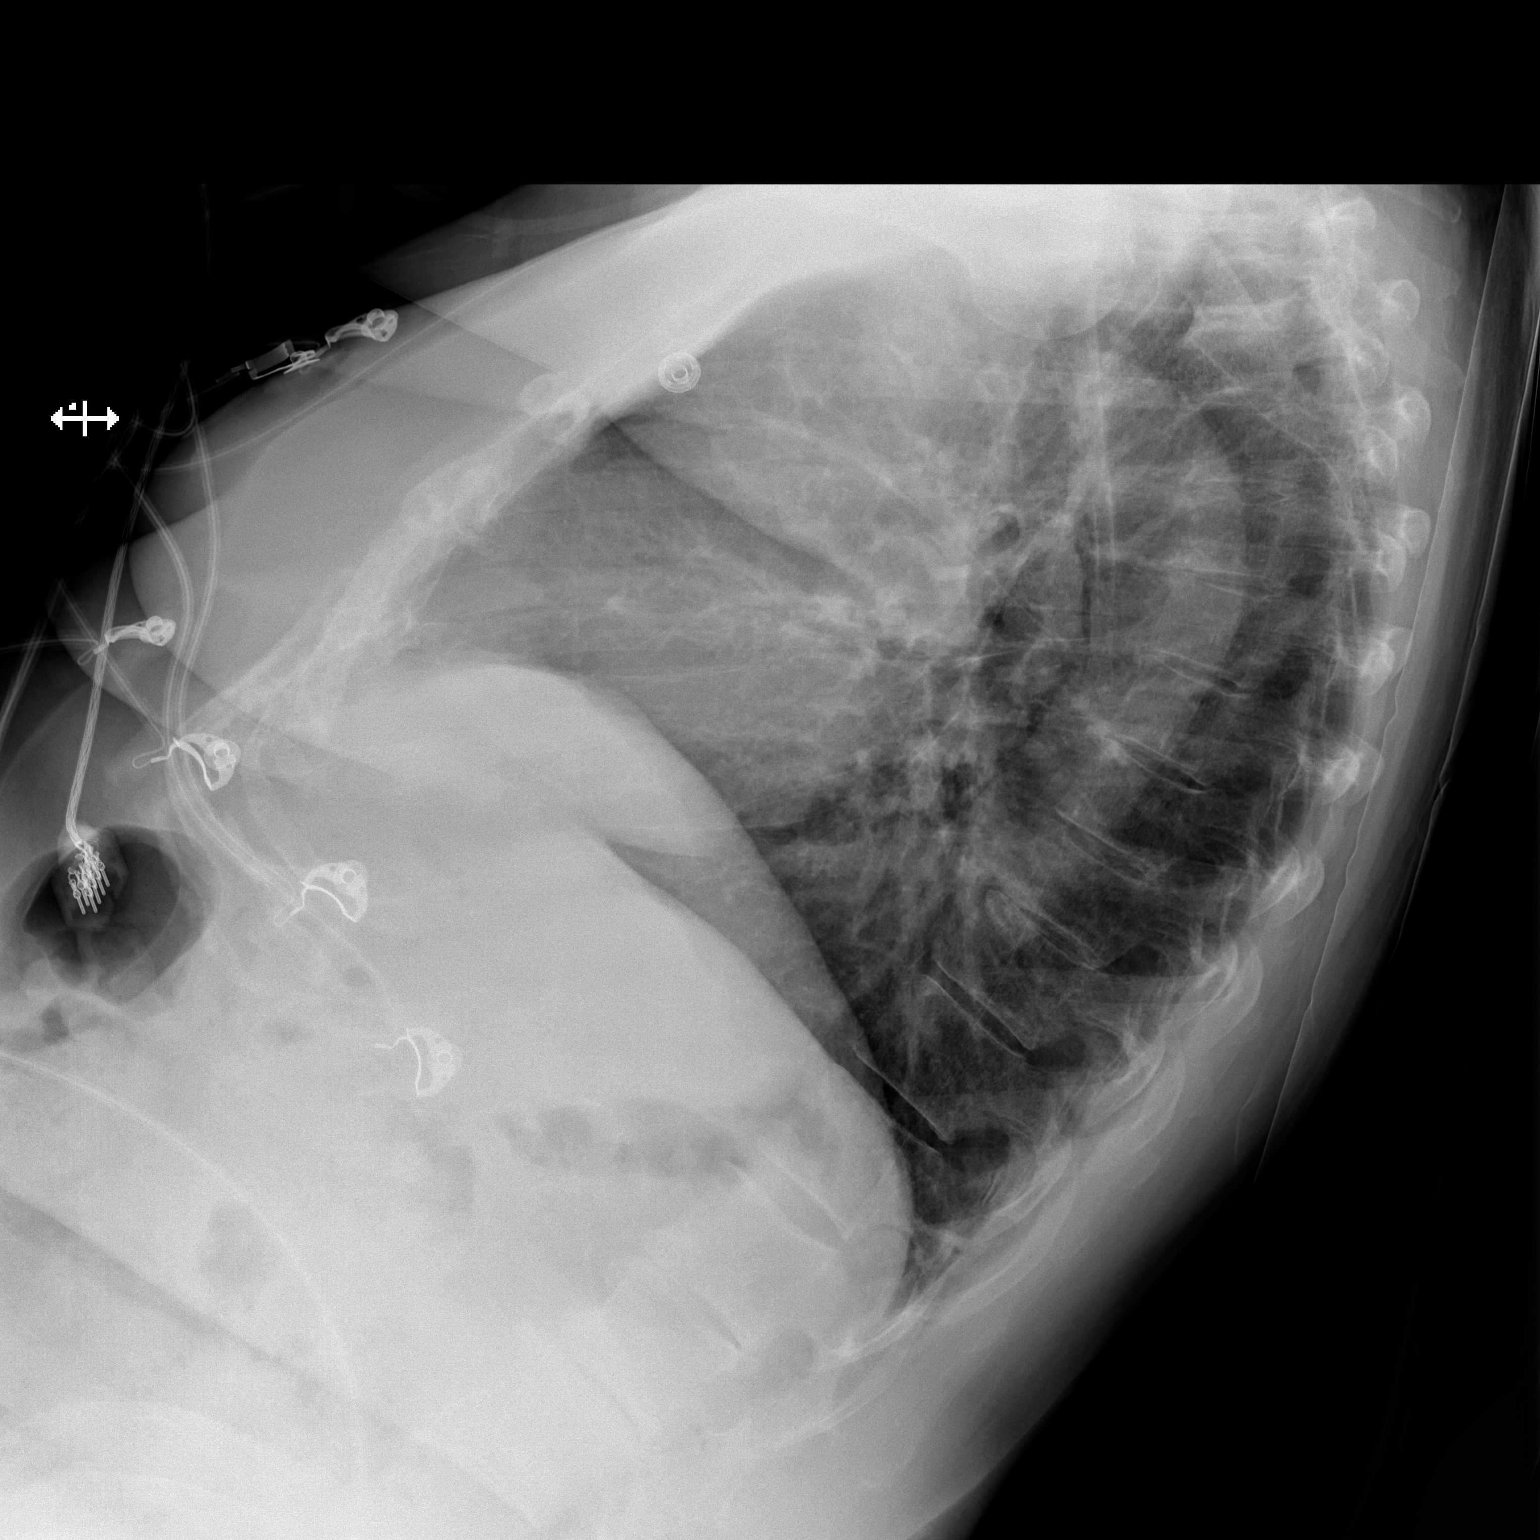

[x chest ap]
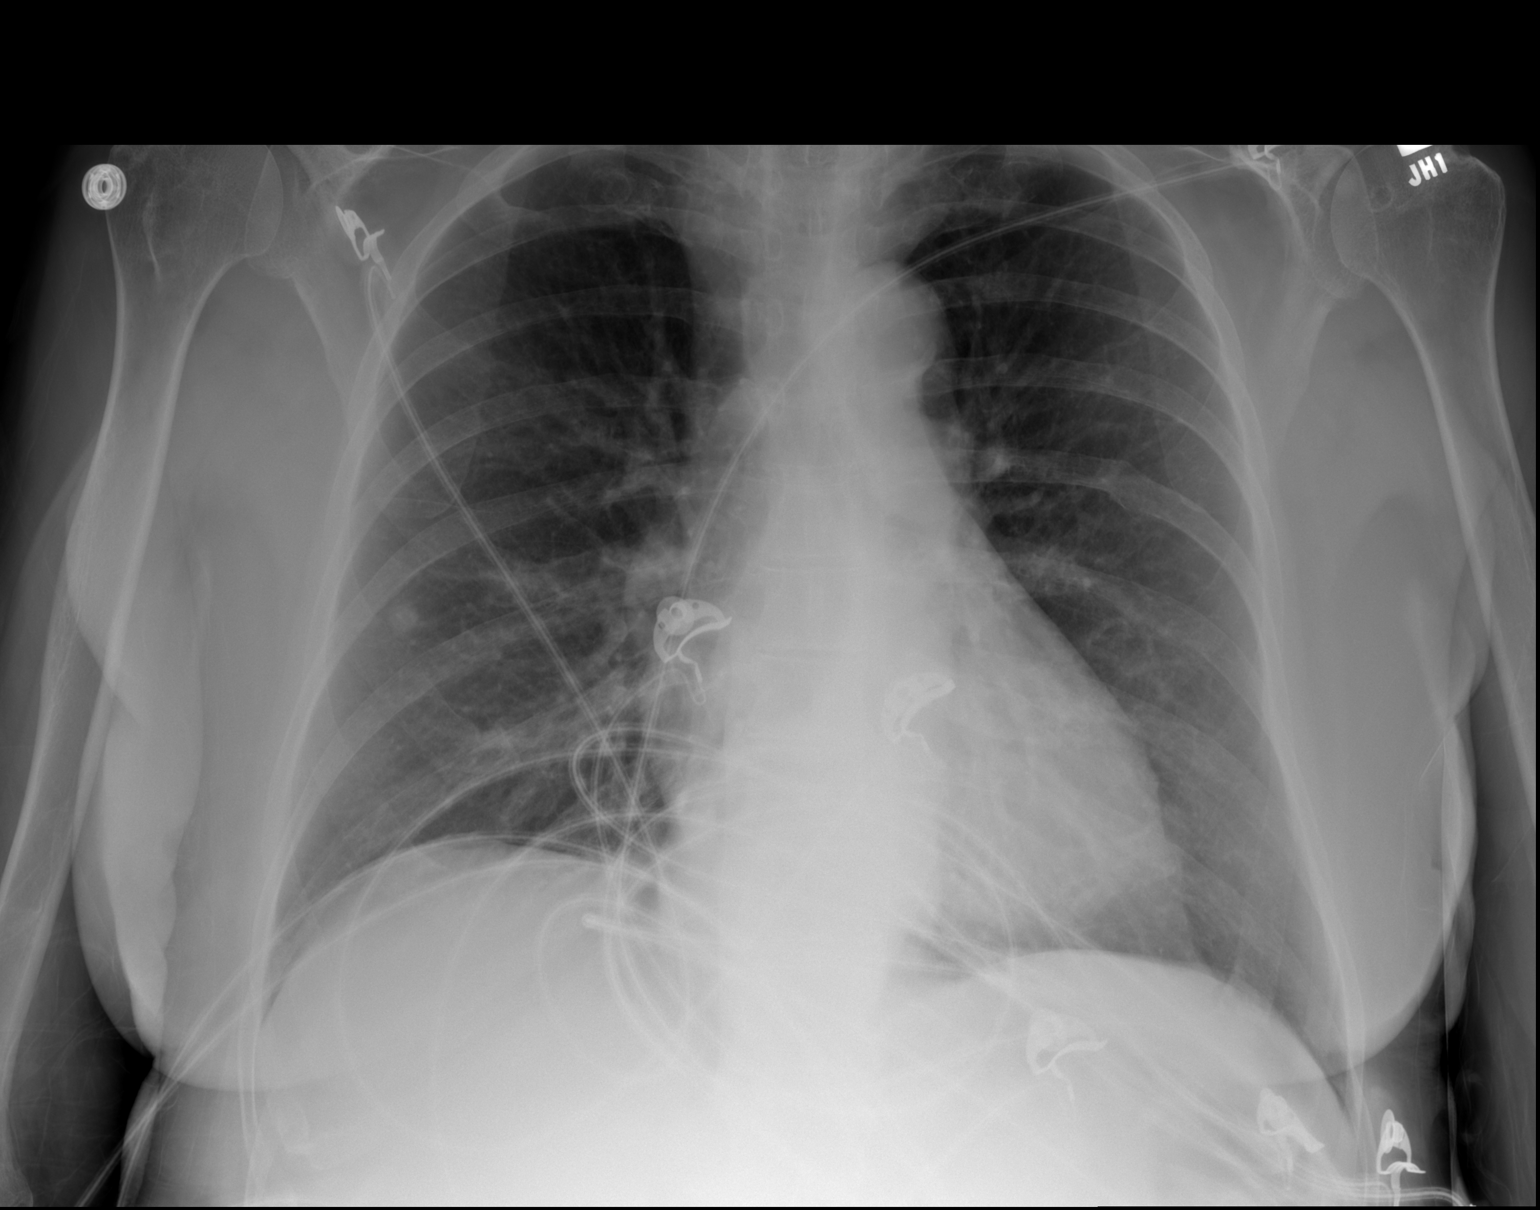

[2 of 2 positions shown; findings below may reference images not displayed]

FINDINGS: Normal heart size and pulmonary vascularity. No airspace disease or
consolidation in the lungs. No blunting of costophrenic angles. No
pneumothorax. Nodule in the right mid lung is unchanged since
previous study suggesting benign etiology. Mild calcification of the
aorta. Old left rib fractures.
IMPRESSION: No active cardiopulmonary disease.
